# Patient Record
Sex: Female | Born: 1950 | Race: Black or African American | Hispanic: No | Marital: Married | Smoking: Never smoker
Health system: Southern US, Community
[De-identification: ages and names within clinical notes are randomized; demographics above are authoritative.]

## PROBLEM LIST (undated history)

## (undated) DIAGNOSIS — E119 Type 2 diabetes mellitus without complications: Secondary | ICD-10-CM

## (undated) DIAGNOSIS — I10 Essential (primary) hypertension: Secondary | ICD-10-CM

## (undated) DIAGNOSIS — E7800 Pure hypercholesterolemia, unspecified: Secondary | ICD-10-CM

---

## 2009-07-02 ENCOUNTER — Emergency Department (HOSPITAL_COMMUNITY): Admission: EM | Admit: 2009-07-02 | Discharge: 2009-07-02 | Payer: Self-pay | Admitting: Family Medicine

## 2010-07-11 LAB — GLUCOSE, CAPILLARY: Glucose-Capillary: 141 mg/dL — ABNORMAL HIGH (ref 70–99)

## 2011-03-23 ENCOUNTER — Ambulatory Visit: Payer: Self-pay | Admitting: Gastroenterology

## 2014-07-27 ENCOUNTER — Other Ambulatory Visit: Payer: Self-pay

## 2014-07-27 DIAGNOSIS — Z1231 Encounter for screening mammogram for malignant neoplasm of breast: Secondary | ICD-10-CM

## 2014-08-03 ENCOUNTER — Ambulatory Visit
Admission: RE | Admit: 2014-08-03 | Discharge: 2014-08-03 | Disposition: A | Discharge status: Home / Self Care | Payer: Medicaid Other | Source: Ambulatory Visit

## 2014-08-03 DIAGNOSIS — Z1231 Encounter for screening mammogram for malignant neoplasm of breast: Secondary | ICD-10-CM

## 2015-05-04 ENCOUNTER — Ambulatory Visit: Payer: Medicaid Other | Attending: Internal Medicine

## 2015-08-17 ENCOUNTER — Other Ambulatory Visit: Payer: Self-pay | Admitting: Internal Medicine

## 2015-08-17 DIAGNOSIS — E2839 Other primary ovarian failure: Secondary | ICD-10-CM

## 2015-09-03 ENCOUNTER — Other Ambulatory Visit: Payer: Self-pay | Admitting: Internal Medicine

## 2015-09-03 DIAGNOSIS — Z1231 Encounter for screening mammogram for malignant neoplasm of breast: Secondary | ICD-10-CM

## 2015-09-23 ENCOUNTER — Ambulatory Visit
Admission: RE | Admit: 2015-09-23 | Discharge: 2015-09-23 | Disposition: A | Discharge status: Home / Self Care | Payer: Medicare Other | Source: Ambulatory Visit | Attending: Internal Medicine | Admitting: Internal Medicine

## 2015-09-23 DIAGNOSIS — E2839 Other primary ovarian failure: Secondary | ICD-10-CM

## 2015-09-23 DIAGNOSIS — Z1231 Encounter for screening mammogram for malignant neoplasm of breast: Secondary | ICD-10-CM

## 2016-01-17 ENCOUNTER — Ambulatory Visit (HOSPITAL_COMMUNITY)
Admission: EM | Admit: 2016-01-17 | Discharge: 2016-01-17 | Disposition: A | Discharge status: Home / Self Care | Payer: Medicare Other | Attending: Internal Medicine | Admitting: Internal Medicine

## 2016-01-17 ENCOUNTER — Encounter (HOSPITAL_COMMUNITY): Payer: Self-pay | Admitting: Emergency Medicine

## 2016-01-17 DIAGNOSIS — R0789 Other chest pain: Secondary | ICD-10-CM

## 2016-01-17 DIAGNOSIS — M940 Chondrocostal junction syndrome [Tietze]: Secondary | ICD-10-CM

## 2016-01-17 DIAGNOSIS — T148XXA Other injury of unspecified body region, initial encounter: Secondary | ICD-10-CM

## 2016-01-17 HISTORY — DX: Pure hypercholesterolemia, unspecified: E78.00

## 2016-01-17 HISTORY — DX: Type 2 diabetes mellitus without complications: E11.9

## 2016-01-17 HISTORY — DX: Essential (primary) hypertension: I10

## 2016-01-17 MED ORDER — NAPROXEN 250 MG PO TABS: 250.0000 mg | ORAL_TABLET | Freq: Two times a day (BID) | ORAL | 0 refills | Status: DC

## 2016-01-17 NOTE — ED Triage Notes (Signed)
Patient has pain across chest and around right shoulder and upper back .  Pain for one week and pain is worse with movement, particularly movement of right arm.  Denies injury

## 2016-01-17 NOTE — ED Provider Notes (Signed)
CSN: [id number]     Arrival date & time 01/17/16  1451 History   First MD Initiated Contact with Patient 01/17/16 1619     Chief Complaint  Patient presents with  . Back Pain  . Chest Pain   (Consider location/radiation/quality/duration/timing/severity/associated sxs/prior Treatment) 65 y o F with son (tranaslator) C/O pain across anterior chest exacerbated with deep breath or other movements. Also c/o pain right upper posterior shoulder/back, especially movement and use of right arm. No hx of trauma.       Past Medical History:  Diagnosis Date  . Diabetes mellitus without complication ([place])   . High cholesterol   . Hypertension    History reviewed. No pertinent surgical history. No family history on file. Social History  Substance Use Topics  . Smoking status: Never Smoker  . Smokeless tobacco: Not on file  . Alcohol use No   OB History    No data available     Review of Systems  Constitutional: Negative for activity change, diaphoresis, fatigue and fever.  HENT: Negative.   Respiratory: Negative.  Negative for cough, chest tightness, shortness of breath and wheezing.   Cardiovascular: Negative for chest pain, palpitations and leg swelling.  Gastrointestinal: Negative.   Genitourinary: Negative.   Musculoskeletal: Positive for myalgias. Negative for neck pain and neck stiffness.  Neurological: Negative.   All other systems reviewed and are negative.   Allergies  Review of patient's allergies indicates no known allergies.  Home Medications   Prior to Admission medications   Medication Sig Start Date End Date Taking? Authorizing Provider  amLODipine (NORVASC) 10 MG tablet Take 10 mg by mouth daily.   Yes Historical Provider, MD  aspirin 81 MG chewable tablet Chew by mouth daily.   Yes Historical Provider, MD  cetirizine (ZYRTEC) 10 MG tablet Take 10 mg by mouth daily.   Yes Historical Provider, MD  insulin NPH-regular Human (NOVOLIN 70/30) (70-30) 100 UNIT/ML  injection Inject into the skin.   Yes Historical Provider, MD  lisinopril-hydrochlorothiazide (PRINZIDE,ZESTORETIC) 20-25 MG tablet Take 1 tablet by mouth daily.   Yes Historical Provider, MD  metFORMIN (GLUCOPHAGE) 1000 MG tablet Take 1,000 mg by mouth 2 (two) times daily with a meal.   Yes Historical Provider, MD  omeprazole (PRILOSEC) 20 MG capsule Take 20 mg by mouth daily.   Yes Historical Provider, MD  simvastatin (ZOCOR) 5 MG tablet Take 5 mg by mouth daily.   Yes Historical Provider, MD  thiamine (VITAMIN B-1) 100 MG tablet Take 100 mg by mouth daily.   Yes Historical Provider, MD  tiZANidine (ZANAFLEX) 2 MG tablet Take by mouth every 6 (six) hours as needed for muscle spasms.   Yes Historical Provider, MD  Vitamin D, Ergocalciferol, (DRISDOL) 50000 units CAPS capsule Take 50,000 Units by mouth every 7 (seven) days.   Yes Historical Provider, MD  naproxen (NAPROSYN) 250 MG tablet Take 1 tablet (250 mg total) by mouth 2 (two) times daily with a meal. 01/17/16   Janne Napoleon, NP   Meds Ordered and Administered this Visit  Medications - No data to display  BP 159/67 (BP Location: Left Arm)   Pulse 74   Temp 99.2 F (37.3 C) (Oral)   Resp 14   SpO2 100%  No data found.   Physical Exam  Constitutional: She is oriented to person, place, and time. She appears well-developed and well-nourished. No distress.  HENT:  Head: Normocephalic and atraumatic.  Eyes: EOM are normal.  Neck: Normal range of  motion. Neck supple.  Cardiovascular: Normal rate.   Murmur heard. 3/6 SEM  Pulmonary/Chest: Effort normal and breath sounds normal. She exhibits tenderness.  Tenderness across the anterior chest R>L to include right pectoralis maj as it inserts to the humerus.  Tenderness to the right supraspinatus, trapezius and right para thoracic musculature.  No discoloration, swelling or deformities.  Musculoskeletal: She exhibits tenderness. She exhibits no edema or deformity.  Lymphadenopathy:     She has no cervical adenopathy.  Neurological: She is alert and oriented to person, place, and time. No cranial nerve deficit.  Skin: Skin is warm and dry. Capillary refill takes less than 2 seconds.  Psychiatric: She has a normal mood and affect.  Nursing note and vitals reviewed.   Urgent Care Course   Clinical Course    Procedures (including critical care time)  Labs Review Labs Reviewed - No data to display  Imaging Review No results found.   Visual Acuity Review  Right Eye Distance:   Left Eye Distance:   Bilateral Distance:    Right Eye Near:   Left Eye Near:    Bilateral Near:         MDM   1. Chest wall pain   2. Costochondritis, acute   3. Muscle strain    Ice to the chest, heat to the upper back where there is muscle soreness and pain. Take Naprosyn twice a day as needed for pain. Be sure to take it with food.  Meds ordered this encounter  Medications  . omeprazole (PRILOSEC) 20 MG capsule    Sig: Take 20 mg by mouth daily.  Marland Kitchen amLODipine (NORVASC) 10 MG tablet    Sig: Take 10 mg by mouth daily.  Marland Kitchen lisinopril-hydrochlorothiazide (PRINZIDE,ZESTORETIC) 20-25 MG tablet    Sig: Take 1 tablet by mouth daily.  . simvastatin (ZOCOR) 5 MG tablet    Sig: Take 5 mg by mouth daily.  Marland Kitchen DISCONTD: meloxicam (MOBIC) 15 MG tablet    Sig: Take 15 mg by mouth daily.  Marland Kitchen tiZANidine (ZANAFLEX) 2 MG tablet    Sig: Take by mouth every 6 (six) hours as needed for muscle spasms.  . cetirizine (ZYRTEC) 10 MG tablet    Sig: Take 10 mg by mouth daily.  . insulin NPH-regular Human (NOVOLIN 70/30) (70-30) 100 UNIT/ML injection    Sig: Inject into the skin.  Marland Kitchen aspirin 81 MG chewable tablet    Sig: Chew by mouth daily.  . Vitamin D, Ergocalciferol, (DRISDOL) 50000 units CAPS capsule    Sig: Take 50,000 Units by mouth every 7 (seven) days.  Marland Kitchen thiamine (VITAMIN B-1) 100 MG tablet    Sig: Take 100 mg by mouth daily.  . metFORMIN (GLUCOPHAGE) 1000 MG tablet    Sig: Take 1,000  mg by mouth 2 (two) times daily with a meal.  . naproxen (NAPROSYN) 250 MG tablet    Sig: Take 1 tablet (250 mg total) by mouth 2 (two) times daily with a meal.    Dispense:  20 tablet    Refill:  0    Order Specific Question:   Supervising Provider    Answer:   Sherlene Shams [id number]       Janne Napoleon, NP 01/17/16 [phone]

## 2016-01-17 NOTE — Discharge Instructions (Signed)
Ice to the chest, heat to the upper back where there is muscle soreness and pain. Take Naprosyn twice a day as needed for pain. Be sure to take it with food.

## 2016-02-08 ENCOUNTER — Other Ambulatory Visit: Payer: Self-pay | Admitting: Gastroenterology

## 2016-02-08 DIAGNOSIS — R112 Nausea with vomiting, unspecified: Secondary | ICD-10-CM

## 2016-02-09 ENCOUNTER — Other Ambulatory Visit: Payer: Self-pay | Admitting: Gastroenterology

## 2016-02-09 DIAGNOSIS — R945 Abnormal results of liver function studies: Principal | ICD-10-CM

## 2016-02-09 DIAGNOSIS — R109 Unspecified abdominal pain: Secondary | ICD-10-CM

## 2016-02-09 DIAGNOSIS — R7989 Other specified abnormal findings of blood chemistry: Secondary | ICD-10-CM

## 2016-02-09 DIAGNOSIS — R112 Nausea with vomiting, unspecified: Secondary | ICD-10-CM

## 2016-02-10 ENCOUNTER — Ambulatory Visit
Admission: RE | Admit: 2016-02-10 | Discharge: 2016-02-10 | Disposition: A | Discharge status: Home / Self Care | Payer: Medicare Other | Source: Ambulatory Visit | Attending: Gastroenterology | Admitting: Gastroenterology

## 2016-02-10 DIAGNOSIS — R7989 Other specified abnormal findings of blood chemistry: Secondary | ICD-10-CM

## 2016-02-10 DIAGNOSIS — R945 Abnormal results of liver function studies: Principal | ICD-10-CM

## 2016-02-10 DIAGNOSIS — R109 Unspecified abdominal pain: Secondary | ICD-10-CM

## 2016-02-10 DIAGNOSIS — R112 Nausea with vomiting, unspecified: Secondary | ICD-10-CM

## 2016-02-10 MED ORDER — IOPAMIDOL (ISOVUE-300) INJECTION 61%
100.0000 mL | Freq: Once | INTRAVENOUS | Status: AC | PRN
  Administered 2016-02-10: 100 mL via INTRAVENOUS

## 2016-02-11 ENCOUNTER — Other Ambulatory Visit: Payer: Self-pay | Admitting: Gastroenterology

## 2016-02-11 DIAGNOSIS — R935 Abnormal findings on diagnostic imaging of other abdominal regions, including retroperitoneum: Secondary | ICD-10-CM

## 2016-02-15 ENCOUNTER — Ambulatory Visit
Admission: RE | Admit: 2016-02-15 | Discharge: 2016-02-15 | Disposition: A | Discharge status: Home / Self Care | Payer: Medicare Other | Source: Ambulatory Visit | Attending: Gastroenterology | Admitting: Gastroenterology

## 2016-02-15 DIAGNOSIS — R935 Abnormal findings on diagnostic imaging of other abdominal regions, including retroperitoneum: Secondary | ICD-10-CM

## 2016-02-15 MED ORDER — IOPAMIDOL (ISOVUE-300) INJECTION 61%
75.0000 mL | Freq: Once | INTRAVENOUS | Status: AC | PRN
  Administered 2016-02-15: 75 mL via INTRAVENOUS

## 2016-02-17 ENCOUNTER — Telehealth: Payer: Self-pay | Admitting: Oncology

## 2016-02-17 ENCOUNTER — Other Ambulatory Visit: Payer: Medicare Other

## 2016-02-17 NOTE — Telephone Encounter (Signed)
An interpreter has been cld for the pt for 11/3 appt w/[facility].

## 2016-02-18 ENCOUNTER — Encounter: Payer: Self-pay | Admitting: *Deleted

## 2016-02-18 ENCOUNTER — Telehealth: Payer: Self-pay | Admitting: Oncology

## 2016-02-18 ENCOUNTER — Ambulatory Visit (HOSPITAL_BASED_OUTPATIENT_CLINIC_OR_DEPARTMENT_OTHER): Payer: Medicare Other | Admitting: Oncology

## 2016-02-18 VITALS — BP 134/58 | HR 82 | Temp 97.8°F | Resp 17 | Ht 65.0 in | Wt 144.3 lb

## 2016-02-18 DIAGNOSIS — R599 Enlarged lymph nodes, unspecified: Secondary | ICD-10-CM | POA: Diagnosis not present

## 2016-02-18 DIAGNOSIS — C801 Malignant (primary) neoplasm, unspecified: Secondary | ICD-10-CM

## 2016-02-18 DIAGNOSIS — C787 Secondary malignant neoplasm of liver and intrahepatic bile duct: Secondary | ICD-10-CM | POA: Diagnosis not present

## 2016-02-18 DIAGNOSIS — R911 Solitary pulmonary nodule: Secondary | ICD-10-CM | POA: Diagnosis not present

## 2016-02-18 DIAGNOSIS — E279 Disorder of adrenal gland, unspecified: Secondary | ICD-10-CM

## 2016-02-18 MED ORDER — DIPHENHYDRAMINE HCL 25 MG PO CAPS: 25.0000 mg | ORAL_CAPSULE | Freq: Four times a day (QID) | ORAL | 0 refills | Status: AC | PRN

## 2016-02-18 MED ORDER — PROMETHAZINE HCL 12.5 MG PO TABS: 12.5000 mg | ORAL_TABLET | Freq: Four times a day (QID) | ORAL | 1 refills | Status: AC | PRN

## 2016-02-18 NOTE — Progress Notes (Signed)
[facility] Psychosocial Distress Screening Clinical Social Work  CSW met with pt; her son and was assisted by [place] interpreter at GI clinic to review role of CSW/Support Team, discuss resources and review distress screening protocol.Clinical Social Work was referred by distress screening protocol.  The patient scored a 8 on the Psychosocial Distress Thermometer which indicates severe distress. Clinical Social Worker met with them at length to assess for distress and other psychosocial needs. Pt resides with her son and his wife and their young children in [place]. Her son travels to [place] weekly for work and his wife does not drive. Transportation is their biggest concern. Pt has MCD, but son feels it is to cover her [facility] premium, but he plans to call MCD and explore this further. CSW reviewed ACS and discussed limited options out of [place] city limits. Family will attempt to transport when able. Pt reports her pain has been difficult and made coping harder. CSW reviewed common emotions and supports to assist pt and family. They are eager to went with dietician and this is pending. Pt and family very open to CSW supports and will follow up as needed.    ONCBCN DISTRESS SCREENING 02/18/2016  Screening Type Initial Screening  Distress experienced in past week (1-10) 8  Practical problem type Transportation  Emotional problem type Adjusting to illness  Physical Problem type Pain;Nausea/vomiting;Loss of appetitie  Physician notified of physical symptoms Yes  Referral to clinical social work Yes  Referral to dietition Yes  Referral to support programs Yes     Clinical Social Worker follow up needed: Yes.    If yes, follow up plan: See above Loren Racer, [place] Worker [place]  [facility] Phone: [phone] Fax: [phone]

## 2016-02-18 NOTE — Patient Instructions (Signed)
Care Plan Summary- 02/18/2016 Name:  Alexandra Hensley        DOB:  1950-09-24 Your Medical Team:  Medical Oncologist:  Dr. Ma Rings Radiation Oncologist:   Surgeon:   Type of Cancer: Undetermined at this time (? Lung, breast, GYN, GI)  Stage/Grade:  *Exact staging of your cancer is based on size of the tumor, depth of invasion, involvement of lymph nodes or not, and whether or not the cancer has spread beyond the primary site   Recommendations: Based on information available as of today's consult. Recommendations may change depending on the results of further tests or exams. 1) Liver biopsy early next week-radiology will call 2) Refer to dietician  3) Phenergan every 6 hours as needed for nausea (do not take at same time as Benadryl) 4) Benadryl 25 mg every 6 hours as needed for itching (do not take at same time as Phenergan) Next Steps: 1) Return to Dr. Benay Spice on 02/25/16 2)  3)    Questions? Merceda Elks, RN, BSN at [phone]. Manuela Schwartz is your Oncology Nurse Navigator and is available to assist you while you're receiving your medical care at [facility].

## 2016-02-18 NOTE — Progress Notes (Signed)
Oncology Nurse Navigator Documentation  Oncology Nurse Navigator Flowsheets 02/18/2016  Navigator Location [facility]  Referral date to RadOnc/MedOnc 02/16/2016  Navigator Encounter Type Clinic/MDC  Abnormal Finding Date 02/10/2016  Multidisiplinary Clinic Date 02/18/2016  Patient Visit Type MedOnc;Initial  Treatment Phase Abnormal Scans;Pre-Tx/Tx Discussion  Barriers/Navigation Needs Education;Family concerns;Transportation  Education Coping with Diagnosis/ Prognosis;Pain/ Symptom Management;Transport During Treatment  Interventions Transportation;Education  Education Method Verbal;Written  Support Groups/Services GI Support Group;[place];Other--[place] Support Services  Acuity Level 3  Time Spent with Patient 64  Met with patient, her son  and translator Alexandra Hensley during new patient visit. Explained the role of the GI Nurse Navigator and provided New Patient Packet with information on: 1. Support groups 2. Advanced Directives 3. Fall Safety Plan Answered questions, reviewed current staging plan using TEACH back and provided emotional support. Provided copy of plan. Primary site of her cancer is yet to be determined and she will be having a liver biopsy next week and return to Dr. Benay Spice on 02/25/16. She is married (husband has moved back to [place]) and lives with one of her sons in [place]. She speaks [place], but her two sons speak [place] well. He main complaints are of itching and nausea and pain. She has been trying to drink the low sugar Boost-was given more samples and coupons by CSW today after her assessment of patient during visit. Will arrange for her to see dietician soon. They have requested to maintain this same interpreter for future visits-made request to [email or web address].  Merceda Elks, RN, BSN GI Oncology [place]

## 2016-02-18 NOTE — Telephone Encounter (Signed)
Appointments scheduled per 02/18/16 los. AVS report and appointment schedule given to patient per 02/18/16 los.

## 2016-02-18 NOTE — Progress Notes (Signed)
[facility] New Patient Consult   Referring MD: Otis Brace, Md [place], [place] [address]   Kiaira Peak 65 y.o.  [birth year]    Reason for Referral: CT with hepatic and bone metastases   HPI: Ms. Ellisor presents today with a [place] interpreter and her English-speaking son. She was evaluated for epigastric pain in August. She underwent an upper endoscopy on 12/21/2015 that revealed an irregular Z line at 38 cm from the incisors. Erythematous mucosa without bleeding was noted at the gastric antrum. Biopsies from the stomach revealed active H. pylori gastritis. Biopsies from the irregular Z line revealed reflux changes. A colonoscopy the same day revealed hemorrhoids and was otherwise normal.  She was treated for H. pylori. She has persistent chest and upper abdominal pain. She developed nausea and vomiting. She saw Dr. Alessandra Bevels in follow-up on 02/08/2016. She was prescribed Zofran for nausea. She reports this caused palpitations. A chemistry panel was remarkable for elevation of the liver enzymes and bilirubin. She was referred for a CT of the abdomen on 02/10/2016. Tiny nodules were noted in the left lower lobe. Numerous hypovascular masses were seen in the liver diffusely consistent with metastases. No biliary ductal dilatation. No pancreas mass or pancreatic ductal dilatation. A 1.5 Zometa right adrenal nodule is suspicious for metastasis. Soft tissue prominence is noted at the GE junction a 1.5 cm porta hepatis node and a 1.4 cm left periaortic node were noted. There was also a 1.6 cm left common iliac node. She was referred for a chest CT 02/15/2016. There is a 17 mm precarinal node. Tiny right and left lung nodules were noted. A pathologic fracture through a lytic lesion is noted at the start of with additional lytic metastases in the mid sternum.  She complains of pain at the anterior chest. She has intermittent nausea and vomiting.  Past  Medical History:  Diagnosis Date  . Diabetes mellitus without complication ([place])   . High cholesterol   . Hypertension      .  G1 P1    .  Hepatitis C treated with medical therapy at the [facility]   Past surgical history: None   Medications: Reviewed  Allergies: No Known Allergies  Family history: No family history of cancer  Social History:   She has lived in [place] with her son for the past 7 years. She previously lived in [place]. She previously worked as an Surveyor, minerals. No transfusion history. No risk factor for HIV.   ROS:   Positives include: Anterior chest pain, nausea/vomiting, constipation, hiccups, solid dysphagia  A complete ROS was otherwise negative.  Physical Exam:  Blood pressure (!) 134/58, pulse 82, temperature 97.8 F (36.6 C), temperature source Oral, resp. rate 17, height 5\' 5"  (1.651 m), weight 144 lb 4.8 oz (65.5 kg), SpO2 99 %.  HEENT: Oropharynx without visible mass, upper and lower denture plate, neck without mass. Scleral icterus Lungs: Clear bilaterally Cardiac: Regular rate and rhythm Abdomen: No splenomegaly. The liver edge is palpable in the mid upper abdomen  Vascular: No leg edema Lymph nodes: No cervical, supraclavicular, axillary, or inguinal nodes Neurologic: Alert and oriented, the motor exam appears intact in the upper and lower extremities Skin: Jaundice Musculoskeletal: No spine tenderness Breasts: Bilateral breast without mass   LAB:  CBC From [facility] on 02/08/2016-chemotherapy 12.2, MCV 90.5, plate was QA348G, white count 8.9, ANC 6.8, absolute lymphocyte count 1.2  CMP   From [facility] on  02/08/2016-BUN 19, currently 0.86, calcium 10.2, albumin 3.8, bilirubin 3.4, alkaline phosphatase 1077, AST 107, ALT 59   Imaging:  As per history of present illness, CT images reviewed with Ms. Nalepa   Assessment/Plan:   1. Nausea/vomiting, abdominal pain, pruritus, solid dysphagia  2. CT abdomen  02/10/2016 and CT chest 02/15/2016 with small lung nodules, extensive liver metastases, pathologic fracture of the sternum, enlarged chest and abdominal lymph nodes, right adrenal nodule, ill-defined soft tissue prominence at the GE junction   Disposition:   Ms. Aman presents with abdominal pain, nausea/vomiting, pruritus, and dysphagia. The clinical presentation and CT scans are consistent with advanced metastatic carcinoma. No primary tumor site is apparent on review of her history, physical examination, and staging CT scans. She underwent upper and lower endoscopy procedures in September and no primary tumor site was found.  The differential diagnosis includes an upper GI malignancy that was not appreciated on the EGD and September versus metastatic carcinoma from another site. She has a history of hepatitis C, but this would be an unusual presentation for metastatic hepatocellular carcinoma.  I will refer her for a biopsy of the liver within the next few days. She will return for an office visit and further discussion next week. We may ask Dr. Alessandra Bevels to perform a repeat endoscopy.  We gave her a prescription for Phenergan to use as needed for nausea. She will use Benadryl as needed for pruritus. We recommended liquid nutrition supplements.  Approximately 50 minutes were spent with the patient today. The majority of the time was used for counseling and coordination of care.  Betsy Coder, MD  02/18/2016, 10:50 AM

## 2016-02-21 ENCOUNTER — Encounter: Payer: Self-pay | Admitting: Oncology

## 2016-02-21 NOTE — Progress Notes (Signed)
Prior auth request sent through Cover My Meds for Promethazine HCL.

## 2016-02-22 ENCOUNTER — Telehealth: Payer: Self-pay | Admitting: Oncology

## 2016-02-22 ENCOUNTER — Encounter (HOSPITAL_COMMUNITY): Payer: Self-pay

## 2016-02-22 ENCOUNTER — Telehealth: Payer: Self-pay | Admitting: *Deleted

## 2016-02-22 ENCOUNTER — Encounter: Payer: Self-pay | Admitting: Oncology

## 2016-02-22 ENCOUNTER — Other Ambulatory Visit: Payer: Self-pay | Admitting: Radiology

## 2016-02-22 ENCOUNTER — Other Ambulatory Visit: Payer: Self-pay | Admitting: Oncology

## 2016-02-22 ENCOUNTER — Ambulatory Visit (HOSPITAL_COMMUNITY)
Admission: RE | Admit: 2016-02-22 | Discharge: 2016-02-22 | Disposition: A | Discharge status: Home / Self Care | Payer: Medicare Other | Source: Ambulatory Visit | Attending: Oncology | Admitting: Oncology

## 2016-02-22 DIAGNOSIS — C801 Malignant (primary) neoplasm, unspecified: Secondary | ICD-10-CM | POA: Diagnosis present

## 2016-02-22 LAB — CBC
HCT: 34.6 % — ABNORMAL LOW (ref 36.0–46.0)
HEMOGLOBIN: 11.6 g/dL — AB (ref 12.0–15.0)
MCH: 29.7 pg (ref 26.0–34.0)
MCHC: 33.5 g/dL (ref 30.0–36.0)
MCV: 88.5 fL (ref 78.0–100.0)
Platelets: 179 10*3/uL (ref 150–400)
RBC: 3.91 MIL/uL (ref 3.87–5.11)
RDW: 17.6 % — ABNORMAL HIGH (ref 11.5–15.5)
WBC: 9.7 10*3/uL (ref 4.0–10.5)

## 2016-02-22 LAB — GLUCOSE, CAPILLARY: Glucose-Capillary: 164 mg/dL — ABNORMAL HIGH (ref 65–99)

## 2016-02-22 LAB — PROTIME-INR
INR: 1.09
Prothrombin Time: 14.2 seconds (ref 11.4–15.2)

## 2016-02-22 LAB — APTT: aPTT: 26 seconds (ref 24–36)

## 2016-02-22 MED ORDER — MIDAZOLAM HCL 2 MG/2ML IJ SOLN
INTRAMUSCULAR | Status: AC
  Filled 2016-02-22: qty 2

## 2016-02-22 MED ORDER — FENTANYL CITRATE (PF) 100 MCG/2ML IJ SOLN
INTRAMUSCULAR | Status: AC | PRN
  Administered 2016-02-22 (×2): 25 ug via INTRAVENOUS

## 2016-02-22 MED ORDER — SODIUM CHLORIDE 0.9 % IV SOLN
INTRAVENOUS | Status: AC | PRN
  Administered 2016-02-22: 10 mL/h via INTRAVENOUS

## 2016-02-22 MED ORDER — FENTANYL CITRATE (PF) 100 MCG/2ML IJ SOLN
INTRAMUSCULAR | Status: AC
  Filled 2016-02-22: qty 2

## 2016-02-22 MED ORDER — MIDAZOLAM HCL 2 MG/2ML IJ SOLN
INTRAMUSCULAR | Status: AC | PRN
  Administered 2016-02-22: 1 mg via INTRAVENOUS
  Administered 2016-02-22: 0.5 mg via INTRAVENOUS

## 2016-02-22 MED ORDER — SODIUM CHLORIDE 0.9 % IV SOLN: INTRAVENOUS | Status: DC

## 2016-02-22 MED ORDER — LIDOCAINE HCL 1 % IJ SOLN
INTRAMUSCULAR | Status: AC
  Filled 2016-02-22: qty 20

## 2016-02-22 NOTE — H&P (Signed)
    Chief Complaint: liver lesions  Referring Physician:Dr. Izola Price. [facility]  Supervising Physician: Daryll Brod  Patient Status: [facility] - Out-pt  HPI: Alexandra Hensley is an 65 y.o. female who was seen by Dr. Alessandra Bevels with GI secondary to complaints of epigastric pain.  She underwent and upper and lower endo in August where she was found to have H. Pylori.  This was treated, but no other signs of disease.  She was continuing to have symptoms in October at her follow up and a CT scan was then obtained of her abdomen.  This revealed lesions in her liver concerning for metastatic disease.  She then had a CT of the chest that revealed some pulmonary nodules as well as a lytic lesion in her spine.  She was referred to oncology who has requested a biopsy of a liver lesion to determine source of malignancy.  She has no complaints except for occasional "stomach-ache" type pains.  Her son translates for her.  Past Medical History:  Past Medical History:  Diagnosis Date  . Diabetes mellitus without complication ([place])   . High cholesterol   . Hypertension     Past Surgical History: History reviewed. No pertinent surgical history.  Family History: History reviewed. No pertinent family history.  Social History:  reports that she has never smoked. She does not have any smokeless tobacco history on file. She reports that she does not drink alcohol or use drugs.  Allergies: No Known Allergies  Medications: Medications reviewed in Epic  Please HPI for pertinent positives, otherwise complete 10 system ROS negative.  Mallampati Score: MD Evaluation Airway: WNL Heart: WNL Abdomen: WNL Chest/ Lungs: WNL ASA  Classification: 2 Mallampati/Airway Score: Two  Physical Exam: BP (!) (P) 157/70   Pulse (P) 87   Temp (P) 98.5 F (36.9 C)   Resp (P) 18   Ht (P) 5\' 5"  (1.651 m)   SpO2 (P) 99%  There is no height or weight on file to calculate BMI. General: pleasant, WD, WN white female who is  laying in bed in NAD HEENT: head has a scarf/hat in place.  Sclera are noninjected.  PERRL.  Ears and nose without any masses or lesions.  Mouth is pink and moist Heart: regular, rate, and rhythm.  Normal s1,s2. No obvious murmurs, gallops, or rubs noted.  Palpable radial and pedal pulses bilaterally Lungs: CTAB, no wheezes, rhonchi, or rales noted.  Respiratory effort nonlabored Abd: soft, NT, ND, +BS, no masses, hernias, or organomegaly Psych: A&Ox3 with an appropriate affect.   Labs: pending  Imaging: No results found.  Assessment/Plan 1. Liver lesions -we will plan to proceed with an US-guided liver lesion biopsy today. -vitals have been reviewed, labs are pending -Risks and Benefits discussed with the patient including, but not limited to bleeding, infection, damage to adjacent structures or low yield requiring additional tests. All of the patient's questions were answered, patient is agreeable to proceed. Consent signed and in chart.  Thank you for this interesting consult.  I greatly enjoyed meeting Alexandra Hensley and look forward to participating in their care.  A copy of this report was sent to the requesting provider on this date.  Electronically Signed: Henreitta Cea 02/22/2016, 12:27 PM   I spent a total of  30 Minutes   in face to face in clinical consultation, greater than 50% of which was counseling/coordinating care for liver lesions

## 2016-02-22 NOTE — Telephone Encounter (Signed)
Unable to reach pt son to inform of 11/15 appt per LOS. Will try later.

## 2016-02-22 NOTE — Discharge Instructions (Signed)

## 2016-02-22 NOTE — Telephone Encounter (Signed)
  Oncology Nurse Navigator Documentation  Navigator Location: [facility] (02/22/16 1012)   )Navigator Encounter Type: Telephone (02/22/16 1012) Telephone: Lahoma Crocker Call;Appt Confirmation/Clarification (02/22/16 1012)   Called son with change in follow up appointment to 2 pm on 02/25/16. They will be able to make the appointment. Patient is having her biopsy today.

## 2016-02-22 NOTE — Progress Notes (Signed)
Received prior authorization approval from [facility] for Promethazine 12.5MG  for 02/21/16-04/16/17. Called [facility] @[phone] to provide this information. Pharmacy staff states it went through and they would get it ready.

## 2016-02-22 NOTE — Procedures (Signed)
S/p US LIVER MASS BX, LEFT LOBE  NO COMP STABLE EBL < 5CC  PATH PENDING FULL REPORT IN PACS

## 2016-02-25 ENCOUNTER — Other Ambulatory Visit: Payer: Self-pay | Admitting: *Deleted

## 2016-02-25 ENCOUNTER — Encounter: Payer: Self-pay | Admitting: *Deleted

## 2016-02-25 ENCOUNTER — Ambulatory Visit (HOSPITAL_BASED_OUTPATIENT_CLINIC_OR_DEPARTMENT_OTHER): Payer: Medicare Other

## 2016-02-25 ENCOUNTER — Other Ambulatory Visit: Payer: Self-pay | Admitting: Oncology

## 2016-02-25 ENCOUNTER — Telehealth: Payer: Self-pay | Admitting: *Deleted

## 2016-02-25 ENCOUNTER — Ambulatory Visit (HOSPITAL_BASED_OUTPATIENT_CLINIC_OR_DEPARTMENT_OTHER): Payer: Medicare Other | Admitting: Oncology

## 2016-02-25 VITALS — BP 146/74 | HR 98 | Temp 98.3°F | Resp 17 | Ht 65.0 in | Wt 143.3 lb

## 2016-02-25 DIAGNOSIS — C799 Secondary malignant neoplasm of unspecified site: Secondary | ICD-10-CM

## 2016-02-25 DIAGNOSIS — C801 Malignant (primary) neoplasm, unspecified: Secondary | ICD-10-CM

## 2016-02-25 DIAGNOSIS — R911 Solitary pulmonary nodule: Secondary | ICD-10-CM

## 2016-02-25 DIAGNOSIS — R110 Nausea: Secondary | ICD-10-CM | POA: Diagnosis not present

## 2016-02-25 DIAGNOSIS — K5900 Constipation, unspecified: Secondary | ICD-10-CM

## 2016-02-25 DIAGNOSIS — E279 Disorder of adrenal gland, unspecified: Secondary | ICD-10-CM

## 2016-02-25 DIAGNOSIS — C787 Secondary malignant neoplasm of liver and intrahepatic bile duct: Secondary | ICD-10-CM | POA: Diagnosis not present

## 2016-02-25 DIAGNOSIS — L299 Pruritus, unspecified: Secondary | ICD-10-CM | POA: Diagnosis not present

## 2016-02-25 DIAGNOSIS — R599 Enlarged lymph nodes, unspecified: Secondary | ICD-10-CM

## 2016-02-25 DIAGNOSIS — C249 Malignant neoplasm of biliary tract, unspecified: Secondary | ICD-10-CM

## 2016-02-25 DIAGNOSIS — G893 Neoplasm related pain (acute) (chronic): Secondary | ICD-10-CM | POA: Diagnosis not present

## 2016-02-25 DIAGNOSIS — K831 Obstruction of bile duct: Secondary | ICD-10-CM

## 2016-02-25 LAB — COMPREHENSIVE METABOLIC PANEL
ALT: 138 U/L — AB (ref 0–55)
ANION GAP: 13 meq/L — AB (ref 3–11)
AST: 204 U/L (ref 5–34)
Albumin: 2.2 g/dL — ABNORMAL LOW (ref 3.5–5.0)
Alkaline Phosphatase: 1401 U/L — ABNORMAL HIGH (ref 40–150)
BUN: 18.2 mg/dL (ref 7.0–26.0)
CALCIUM: 9.7 mg/dL (ref 8.4–10.4)
CHLORIDE: 86 meq/L — AB (ref 98–109)
CO2: 27 mEq/L (ref 22–29)
CREATININE: 0.9 mg/dL (ref 0.6–1.1)
EGFR: 80 mL/min/{1.73_m2} — ABNORMAL LOW (ref >90)
Glucose: 207 mg/dl — ABNORMAL HIGH (ref 70–140)
Potassium: 3.5 mEq/L (ref 3.5–5.1)
Sodium: 126 mEq/L — ABNORMAL LOW (ref 136–145)
Total Bilirubin: 21.07 mg/dL (ref 0.20–1.20)
Total Protein: 6.9 g/dL (ref 6.4–8.3)

## 2016-02-25 MED ORDER — OXYCODONE-ACETAMINOPHEN 5-325 MG PO TABS
2.0000 | ORAL_TABLET | Freq: Once | ORAL | Status: AC
  Administered 2016-02-25: 2 via ORAL

## 2016-02-25 MED ORDER — OXYCODONE-ACETAMINOPHEN 5-325 MG PO TABS: 1.0000 | ORAL_TABLET | ORAL | 0 refills | Status: DC | PRN

## 2016-02-25 MED ORDER — OXYCODONE-ACETAMINOPHEN 5-325 MG PO TABS
ORAL_TABLET | ORAL | Status: AC
  Filled 2016-02-25: qty 2

## 2016-02-25 MED ORDER — DOCUSATE SODIUM 100 MG PO CAPS: 100.0000 mg | ORAL_CAPSULE | Freq: Two times a day (BID) | ORAL | 0 refills | Status: AC

## 2016-02-25 MED ORDER — POLYETHYLENE GLYCOL 3350 17 GM/SCOOP PO POWD: 17.0000 g | Freq: Every day | ORAL | 0 refills | Status: AC

## 2016-02-25 NOTE — Progress Notes (Signed)
Alex[facility] OFFICE PROGRESS NOTE   Diagnosis: Metastatic adenocarcinoma  INTERVAL HISTORY:   Alexandra Hensley returns as scheduled. She is accompanied by an interpreter and her sons. She has limited intake of food. She complains of diffuse abdominal pain. The pain is not relieved with tramadol. She underwent an ultrasound-guided biopsy of a liver lesion on 02/22/2016. The pathology [id number]) revealed adenocarcinoma. There is a poorly differentiated adenocarcinoma with abundant necrosis, intra-cytoplasmic and extracellular mucin. The malignant cells were positive for cytokeratin 7 with faint staining for cytokeratin 20. The cells were negative breast receptor, gross cystic disease fluid protein, and TTF-1-1. 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  She complains of constipation.   Objective:  Vital signs in last 24 hours:  Blood pressure (!) 146/74, pulse 98, temperature 98.3 F (36.8 C), temperature source Oral, resp. rate 17, height 5\' 5"  (1.651 m), weight 143 lb 4.8 oz (65 kg), SpO2 100 %.    HEENT:  scleral icterus Resp:  lungs clear anteriorly Cardio:  regular rate and rhythm, 2/6 systolic murmur GI:  the liver is palpable in the mid and left abdomen Vascular:  no leg edema  Skin: Jaundice     Lab Results:  Lab Results  Component Value Date   WBC 9.7 02/22/2016   HGB 11.6 (L) 02/22/2016   HCT 34.6 (L) 02/22/2016   MCV 88.5 02/22/2016   PLT 179 02/22/2016     Imaging:  US Biopsy  Result Date: 02/22/2016 INDICATION: Liver metastases, unknown primary EXAM: ULTRASOUND BIOPSY CORE LIVER MEDICATIONS: None. ANESTHESIA/SEDATION: Moderate (conscious) sedation was employed during this procedure. A total of Versed 1 mg and Fentanyl 50 mcg was administered intravenously. Moderate Sedation Time: 10 minutes. The patient's level of consciousness and vital signs were monitored continuously by radiology nursing  throughout the procedure under my direct supervision. FLUOROSCOPY TIME:  Fluoroscopy Time: None. COMPLICATIONS: None immediate. PROCEDURE: Informed written consent was obtained from the patient after a thorough discussion of the procedural risks, benefits and alternatives. All questions were addressed. Maximal Sterile Barrier Technique was utilized including caps, mask, sterile gowns, sterile gloves, sterile drape, hand hygiene and skin antiseptic. A timeout was performed prior to the initiation of the procedure. Preliminary ultrasound performed. Hypoechoic solid liver metastases present in the left hepatic lobe from a subxiphoid window. Overlying skin marked. This was correlated with the prior CT. Under sterile conditions and local anesthesia, a 17 gauge 6.8 cm access needle was advanced percutaneously under direct ultrasound into the left hepatic metastasis. Needle position confirmed with ultrasound. 3 18 gauge core biopsies obtained. Samples placed in formalin. Needle removed. Postprocedure imaging demonstrates no hemorrhage or hematoma. Patient tolerated the biopsy well. IMPRESSION: Successful ultrasound left hepatic mass 18 gauge core biopsies Electronically Signed   By: Jerilynn Mages.  Shick M.D.   On: 02/22/2016 15:32    Medications: I have reviewed the patient's current medications.  Assessment/Plan:   1. Metastatic adenocarcinoma   CT abdomen 02/10/2016 and CT chest 02/15/2016 with small lung nodules, extensive liver metastases, pathologic fracture of the sternum, enlarged chest and abdominal lymph nodes, right adrenal nodule, ill-defined soft tissue prominence at the GE junction 2. Chest/abdominal pain secondary to #1   3.  Nausea and pruritus secondary to #1   Disposition:   Alexandra Hensley has been diagnosed with metastatic adenocarcinoma involving the liver. She has unknown primary carcinoma, but I suspect a biliary or pancreatic primary. We decided to check a CA 19-9 today.  I discussed the  prognosis and treatment options with her. She understands no therapy will be curative. She has a poor performance status and large tumor burden at present. I recommend comfort care with a [facility] hospice referral. She agrees to a Hospice referral.  She had significant pain while in the office today. She was given 2 Percocet tablets and obtained significant relief. We gave her prescription for Percocet to use as needed.  She is taking multiple medications. We reviewed her medication list and eliminated some of the nonessential medications. She will begin a bowel regimen.  Alexandra Hensley will return for an office visit in approximately one week.  Approximately 40 minutes were spent with the patient today.  02/25/2016  3:09 PM

## 2016-02-25 NOTE — Telephone Encounter (Signed)
Referral called to [place]. Contact information  given for interpreter Kerrin Champagne [phone].

## 2016-02-25 NOTE — Progress Notes (Signed)
Oncology Nurse Navigator Documentation Oncology Nurse Navigator Documentation  Oncology Nurse Navigator Flowsheets 02/25/2016  Navigator Location [facility]  Referral date to RadOnc/MedOnc -  Navigator Encounter Type Follow-up Appt  Telephone -  Abnormal Finding Date -  Confirmed Diagnosis Date 02/22/2016  Multidisiplinary Clinic Date -  Patient Visit Type MedOnc;Follow-up  Treatment Phase [facility] Needs Education;Coordination of Care  Education Pain/ Symptom Management--nausea and bowel meds and how to take them  Interventions Medication Assistance;Coordination of Care  Coordination of Care Appts--scheduled F/U appointment to avoid long wait in scheduling line  Education Method Verbal;Written;Teach-back--taught both sons  Support Groups/Services -  Acuity Level 2  Time Spent with Patient 30  Called lab to come to exam room to draw labs due to her pain and weakness. Percocet given earlier has helped her pain significantly. MD recommends Hospice at this time and patient/sons agree. Her condition has declined since last visit and she is now jaundiced. Interpreter, Kerrin Champagne present for the visit to interpret for patient. Patient became tearful when told MD can't treat her now based on her current performance status and that the cancer can't be cured. Collaborative nurse will call in Hospice referral per order of Dr. Benay Spice.

## 2016-02-26 LAB — CANCER ANTIGEN 19-9: CA 19-9: 874 U/mL — ABNORMAL HIGH (ref 0–35)

## 2016-02-28 ENCOUNTER — Encounter (HOSPITAL_COMMUNITY): Payer: Self-pay

## 2016-02-28 ENCOUNTER — Telehealth: Payer: Self-pay | Admitting: *Deleted

## 2016-02-28 ENCOUNTER — Ambulatory Visit (HOSPITAL_COMMUNITY): Payer: Medicare Other

## 2016-02-28 ENCOUNTER — Ambulatory Visit (HOSPITAL_COMMUNITY)
Admission: RE | Admit: 2016-02-28 | Discharge: 2016-02-28 | Disposition: A | Discharge status: Home / Self Care | Payer: Medicare Other | Source: Ambulatory Visit | Attending: Oncology | Admitting: Oncology

## 2016-02-28 DIAGNOSIS — C799 Secondary malignant neoplasm of unspecified site: Secondary | ICD-10-CM

## 2016-02-28 DIAGNOSIS — I700 Atherosclerosis of aorta: Secondary | ICD-10-CM | POA: Diagnosis not present

## 2016-02-28 DIAGNOSIS — K831 Obstruction of bile duct: Secondary | ICD-10-CM | POA: Insufficient documentation

## 2016-02-28 DIAGNOSIS — R911 Solitary pulmonary nodule: Secondary | ICD-10-CM | POA: Diagnosis not present

## 2016-02-28 DIAGNOSIS — C772 Secondary and unspecified malignant neoplasm of intra-abdominal lymph nodes: Secondary | ICD-10-CM | POA: Insufficient documentation

## 2016-02-28 DIAGNOSIS — C787 Secondary malignant neoplasm of liver and intrahepatic bile duct: Secondary | ICD-10-CM | POA: Diagnosis not present

## 2016-02-28 DIAGNOSIS — C801 Malignant (primary) neoplasm, unspecified: Secondary | ICD-10-CM | POA: Diagnosis present

## 2016-02-28 MED ORDER — IOPAMIDOL (ISOVUE-300) INJECTION 61%
100.0000 mL | Freq: Once | INTRAVENOUS | Status: AC | PRN
  Administered 2016-02-28: 100 mL via INTRAVENOUS

## 2016-02-28 NOTE — Telephone Encounter (Signed)
Son returned call and agrees to CT scan this evening after navigator explained why it is suggested. He will pick up her contrast in few minutes. Reviewed the prep with him and suggested she take a nausea pill before trying to drink the oral contrast.

## 2016-02-28 NOTE — Telephone Encounter (Signed)
Called home # and left VM again for one of her sons to call regarding CT scan today. Still no answer at her son, Annasu's #.

## 2016-02-28 NOTE — Telephone Encounter (Signed)
Call from [facility] with hospice stating hospice MD wants to hold off on admission until after pt has CT scan. Dr. Benay Spice made aware. We will need to refer pt back to hospice once any procedures are complete.

## 2016-02-28 NOTE — Telephone Encounter (Signed)
Oncology Nurse Navigator Documentation  Oncology Nurse Navigator Flowsheets 02/28/2016  Navigator Location [facility]  Referral date to RadOnc/MedOnc -  Navigator Encounter Type Telephone  Telephone Outgoing Call;Patient Update;Appt Confirmation/Clarification  Abnormal Finding Date -  Confirmed Diagnosis Date -  Multidisiplinary Clinic Date -  Patient Visit Type -  Treatment Phase -  Barriers/Navigation Needs Coordination of Care--needs CT abd due to high bilirubin of 21.07. If bile duct dilated, GI could stent her possibly per Dr. Benay Spice  Education -  Interventions Coordination of Care  Coordination of Care Appts;Radiology00CT abdomen at [facility] today at 5:45/6:00 pm.  Drink contrast at 4 pm and 5 pm and NPO 4 hours prior  Education Method -  Support Groups/Services -  Acuity -  Time Spent with Patient 15  Left VM at home # to call navigator regarding CT scan. Unable to contact her son, Anass--phone would ring, then disconnect.

## 2016-02-29 ENCOUNTER — Telehealth: Payer: Self-pay | Admitting: *Deleted

## 2016-02-29 NOTE — Telephone Encounter (Signed)
Left VM with results of the CT scan and that [facility] will be calling for an appoitment to discuss a biliary duct drain being placed which could make her feel more comfortable. Also will be presenting her case at GI tumor conference on Wednesday. Will see her 11/16 as scheduled.

## 2016-03-01 ENCOUNTER — Encounter: Payer: Medicare Other | Admitting: Nutrition

## 2016-03-02 ENCOUNTER — Encounter: Payer: Self-pay | Admitting: *Deleted

## 2016-03-02 ENCOUNTER — Ambulatory Visit: Payer: Medicare Other | Admitting: Nutrition

## 2016-03-02 ENCOUNTER — Ambulatory Visit (HOSPITAL_BASED_OUTPATIENT_CLINIC_OR_DEPARTMENT_OTHER): Payer: Medicare Other | Admitting: Oncology

## 2016-03-02 VITALS — BP 147/81 | HR 102 | Temp 98.7°F | Resp 18 | Wt 148.7 lb

## 2016-03-02 DIAGNOSIS — C787 Secondary malignant neoplasm of liver and intrahepatic bile duct: Secondary | ICD-10-CM

## 2016-03-02 DIAGNOSIS — L299 Pruritus, unspecified: Secondary | ICD-10-CM | POA: Diagnosis not present

## 2016-03-02 DIAGNOSIS — E279 Disorder of adrenal gland, unspecified: Secondary | ICD-10-CM | POA: Diagnosis not present

## 2016-03-02 DIAGNOSIS — C801 Malignant (primary) neoplasm, unspecified: Secondary | ICD-10-CM

## 2016-03-02 DIAGNOSIS — R911 Solitary pulmonary nodule: Secondary | ICD-10-CM

## 2016-03-02 DIAGNOSIS — G893 Neoplasm related pain (acute) (chronic): Secondary | ICD-10-CM | POA: Diagnosis not present

## 2016-03-02 DIAGNOSIS — R110 Nausea: Secondary | ICD-10-CM

## 2016-03-02 DIAGNOSIS — M79604 Pain in right leg: Secondary | ICD-10-CM

## 2016-03-02 DIAGNOSIS — R599 Enlarged lymph nodes, unspecified: Secondary | ICD-10-CM

## 2016-03-02 DIAGNOSIS — C249 Malignant neoplasm of biliary tract, unspecified: Secondary | ICD-10-CM

## 2016-03-02 DIAGNOSIS — K838 Other specified diseases of biliary tract: Secondary | ICD-10-CM

## 2016-03-02 NOTE — Progress Notes (Signed)
[place] OFFICE PROGRESS NOTE   Diagnosis: Metastatic carcinoma  INTERVAL HISTORY:   When she was here last week the bilirubin was markedly elevated. We obtained a CT of the abdomen on 02/28/2016. No common bowel duct dilatation. New mild right intrahepatic biliary dilatation, but no left liver dilatation. She was scheduled to see gastroenterology earlier this week and the appointment was rescheduled for later today. She continues to have pruritus. The pruritus is relieved with Benadryl. Her pain is under better control with oxycodone. She presents today with a [place] interpreter and her son. She has new pain with standing at the right upper posterior leg today.  Objective:  Vital signs in last 24 hours:  Blood pressure (!) 147/81, pulse (!) 102, temperature 98.7 F (37.1 C), temperature source Oral, resp. rate 18, weight 148 lb 11.2 oz (67.4 kg), SpO2 96 %.   Resp: Lungs clear bilaterally Cardio: Regular rate and rhythm GI: The liver is palpable in the midabdomen Vascular: No leg edema, no palpable cord or erythema in the right leg. Tender at the right upper posterior thigh without a palpable mass.  Skin: Jaundice     Lab Results:  Lab Results  Component Value Date   WBC 9.7 02/22/2016   HGB 11.6 (L) 02/22/2016   HCT 34.6 (L) 02/22/2016   MCV 88.5 02/22/2016   PLT 179 02/22/2016  CA 19-9 on 02/25/2016-874  02/25/2016-bilirubin 21.07, alk phosphatase 1401  Imaging:  Ct Abdomen W Contrast  Result Date: 02/28/2016 CLINICAL DATA:  65 year old female with new diagnosis of metastatic adenocarcinoma of unknown primary based on 02/22/2016 liver mass biopsy, presenting with worsening upper abdominal pain radiating to the back. Clinical concern for biliary obstruction. EXAM: CT ABDOMEN WITH CONTRAST TECHNIQUE: Multidetector CT imaging of the abdomen was performed using the standard protocol following bolus administration of intravenous contrast. CONTRAST:  126m  ISOVUE-300 IOPAMIDOL (ISOVUE-300) INJECTION 61% COMPARISON:  02/10/2016 CT abdomen. FINDINGS: Lower chest: Anterior right lower lobe 5 mm solid pulmonary nodule (series 7/ image 1) is slightly increased from 4 mm on 02/10/2016. Hepatobiliary: Liver is enlarged by numerous (greater than 20) confluent heterogeneously hypoenhancing liver masses of varying size, with representative masses as follows: - posterior upper right liver lobe 6.3 x 6.1 cm mass (series 2/image 7), previously 6.4 x 6.0 cm, not appreciably changed - lateral segment left liver lobe 3.1 x 2.8 cm mass (series 2/image 21), previously 3.0 x 2.8 cm, not appreciably changed - segment 4B left liver lobe 2.8 x 2.5 cm mass (series 2/image 23), previously 2.5 x 2.4 cm, mildly increased Relatively contracted gallbladder with diffuse gallbladder wall thickening, not appreciably changed. Ill-defined dense material in the gallbladder lumen could represent sludge and/ or gallstones. There is new mild intrahepatic biliary ductal dilatation in the right liver lobe. No intrahepatic biliary ductal dilatation in the left liver lobe. Normal caliber common bile duct (3 mm diameter). Pancreas: Stable top-normal caliber main pancreatic duct (2 mm diameter). No discrete pancreatic mass. Spleen: Normal size. No mass. Adrenals/Urinary Tract: Stable 1.7 cm right adrenal nodule. No discrete left adrenal nodule. No hydronephrosis. No renal mass. Stomach/Bowel: Grossly normal stomach. Visualized small and large bowel is normal caliber, with no bowel wall thickening. Vascular/Lymphatic: Atherosclerotic nonaneurysmal abdominal aorta. Stable narrowing of the main portal vein by porta hepatis lymphadenopathy. Main portal vein remains patent. Patent splenic and right renal vein. Stable narrowing of the left renal vein by extrinsic compression from retroperitoneal adenopathy. No appreciable change in widespread retroperitoneal lymphadenopathy involving the  gastrohepatic ligament,  porta hepatis, portacaval, aortocaval and left para-aortic chains. For example a 1.4 cm gastrohepatic ligament node (series 2/ image 16), previously 1.5 cm using similar measurement technique, unchanged. A 1.5 cm portacaval node (series 2/ image 24), stable. A 1.4 cm left para-aortic node (series 2/image 31), stable. Other: No pneumoperitoneum. Trace upper abdominal ascites. No focal fluid collection. Musculoskeletal: No aggressive appearing focal osseous lesions. Mild-to-moderate thoracolumbar spondylosis. IMPRESSION: 1. Widespread hepatic metastatic disease is stable mildly increased since 02/10/2016. 2. New mild intrahepatic biliary ductal dilatation in the right liver lobe, due to obstruction by confluent central right liver lobe metastases. No intrahepatic biliary ductal dilatation in the left liver lobe. Normal caliber common bile duct. 3. Widespread retroperitoneal nodal metastatic disease appears stable. Stable extrinsic narrowing of the main portal and left renal veins by retroperitoneal adenopathy. 4. Right lower lobe pulmonary nodule has grown slightly since 02/10/2016, suspicious for pulmonary metastasis . 5. Stable right adrenal nodule suspicious for metastasis. 6. Trace upper abdominal ascites. 7. Contracted thick-walled gallbladder with sludge versus gallstones, unchanged. 8. Aortic atherosclerosis . Electronically Signed   By: Ilona Sorrel M.D.   On: 02/28/2016 19:57    Medications: I have reviewed the patient's current medications.  Assessment/Plan: 1.  Metastatic adenocarcinoma   CT abdomen 02/10/2016 and CT chest 02/15/2016 with small lung nodules, extensive liver metastases, pathologic fracture of the sternum, enlarged chest and abdominal lymph nodes, right adrenal nodule, ill-defined soft tissue prominence at the GE junction  CT abdomen/pelvis 02/28/2016-stable liver metastases, new mild right intrahepatic biliary ductal dilatation, no left intrahepatic ductal dilatation, normal  caliber common bile duct 2. Chest/abdominal pain secondary to #1   3.  Nausea and pruritus secondary to #1    Disposition:  Ms. Fizer appears more comfortable today with the current narcotic regimen. She will continue oxycodone for pain and Benadryl for pruritus. She will the role in the [facility] hospice program.  We presented her case at the GI tumor conference yesterday. The consensus opinion is to recommend Hospice care. The feeling of the conference attendees was that it is unlikely she will benefit from stenting of the right biliary system. She is scheduled to see gastroenterology later today. I will defer a decision on placement of a bile duct stent to them.  She will return for an office visit here in 2 weeks. I suspect the right leg discomfort is related to benign musculoskeletal condition. I have a low suspicion for a deep vein thrombosis.  Betsy Coder, MD  03/02/2016  2:41 PM

## 2016-03-02 NOTE — Progress Notes (Signed)
[place] Work  Clinical Social Work was referred by patient navigator for assistance with care giving concerns.  Clinical Social Worker met with son of patient, Anass at [facility] to offer support and assess for needs.  Pt has three sons and two are here locally, but often travel for work. One son recently had a new baby and has several small children at home. Family is hopeful that pt's third son could come to assist in care giving from the [place]. CSW reviewed process to work to obtain visa for brother. CSW provided son with a list of needed items. CSW willing to assist with process as able. Son stated understanding of process and will reach out as needed.   Clinical Social Work interventions: Caregiving concerns  Loren Racer, [place] Worker [place]  [place] Phone: [phone] Fax: [phone]

## 2016-03-02 NOTE — Progress Notes (Signed)
Oncology Nurse Navigator Documentation  Oncology Nurse Navigator Flowsheets 03/02/2016  Navigator Location [facility]  Referral date to RadOnc/MedOnc -  Navigator Encounter Type Follow-up Appt  Telephone -  Abnormal Finding Date -  Confirmed Diagnosis Date -  Multidisiplinary Clinic Date -  Patient Visit Type MedOnc;Follow-up  Treatment Phase Other--supportive care  Barriers/Navigation Needs Coordination of Care;Family concerns-how to get a family member to [place] to take care of the patient  Education Other--reviewed pain management  Interventions Referrals;Coordination of Care  Referrals Social Work  Coordination of Care Appts--was able to get dietician to see her sooner since she has to go to [facility] at 4:15 today. Escorted patient, son and interpreter to office. Scheduled f/u appointment and made copy for interpreter as well.  Education Method -  Support Groups/Services -  Acuity Level 2  Time Spent with Patient 30

## 2016-03-02 NOTE — Progress Notes (Signed)
65 year old female diagnosed with metastatic adenocarcinoma who is a patient of Dr. Julieanne Manson.  Past medical history includes diabetes, hypertension, hypercholesterolemia, and hepatitis C  Medications include vitamin B6, insulin, Protonix, Phenergan, and vitamin B1.  Labs include glucose 164.  Height: 5 feet 5 inches. Weight: 148 pounds. BMI: 24.0.  I met with patient, her son, and interpreter. She reports poor appetite and nausea. She states she takes two nausea medications on a regular basis. She tried oral nutrition supplements and likes them.  She was told to drink 3 a day. Noted discussion regarding hospice enrollment.  Nutrition diagnosis:  Food and nutrition related knowledge deficit related to metastatic adenocarcinoma as evidenced by no prior need for nutrition related information.  Intervention: Educated patient to try to eat small amounts of food more often. Encouraged patient to try to drink Ensure Plus or equivalent 2-3 times daily. Provided complimentary case of Ensure Plus. Encouraged patient to continue nausea medication and discuss with physician if nausea is not improved. Reviewed high-calorie high-protein foods. Questions were answered.  Teach back method used.  Contact information provided.  Monitoring, evaluation, goals:  Patient will tolerate adequate calories and protein to minimize weight loss and improve quality-of-life.  Next visit: To be scheduled as needed.  **Disclaimer: This note was dictated with voice recognition software. Similar sounding words can inadvertently be transcribed and this note may contain transcription errors which may not have been corrected upon publication of note.**

## 2016-03-06 ENCOUNTER — Telehealth: Payer: Self-pay | Admitting: *Deleted

## 2016-03-06 ENCOUNTER — Other Ambulatory Visit: Payer: Self-pay | Admitting: *Deleted

## 2016-03-06 MED ORDER — OXYCODONE-ACETAMINOPHEN 5-325 MG PO TABS: 1.0000 | ORAL_TABLET | ORAL | 0 refills | Status: AC | PRN

## 2016-03-06 NOTE — Telephone Encounter (Signed)
Son called that his mother needs refill on her pain medication-is almost out. Forwarded request to Dr. Gearldine Shown collaborative nurse and told son he will be called when script is ready for pick up.

## 2016-03-06 NOTE — Telephone Encounter (Signed)
Call placed to patient's son to notify him that prescription for Percocet is ready for pick up.  Pt.'s son appreciative of call.

## 2016-03-08 ENCOUNTER — Telehealth: Payer: Self-pay

## 2016-03-08 NOTE — Telephone Encounter (Signed)
Alexandra Hensley with hospice called to find out if pt had PICC or PORT. No note in chart found. She also reviewed medication list with this RN. Hospice is seeing pt today with an interpreter.

## 2016-03-14 ENCOUNTER — Telehealth: Payer: Self-pay | Admitting: *Deleted

## 2016-03-14 ENCOUNTER — Other Ambulatory Visit: Payer: Self-pay | Admitting: *Deleted

## 2016-03-14 MED ORDER — MORPHINE SULFATE (CONCENTRATE) 10 MG /0.5 ML PO SOLN: ORAL | 0 refills | Status: DC

## 2016-03-14 MED ORDER — LORAZEPAM 0.5 MG PO TABS: ORAL_TABLET | ORAL | 0 refills | Status: AC

## 2016-03-14 NOTE — Telephone Encounter (Signed)
Message received from Vicie Mutters with [facility] requesting liquid anti-emetic and pain medicine for patient.  She stated that pt is transitioning and is unable to take PO medications.  Per Dr. Benay Spice, patient may discontinue all oral medications at this time.  Prescription faxed to [facility] on [address] for Ativan and Roxanol per order of Dr. Benay Spice.  Call placed back to Vicie Mutters and message left to inform her of MD orders.

## 2016-03-15 ENCOUNTER — Other Ambulatory Visit: Payer: Self-pay | Admitting: *Deleted

## 2016-03-15 ENCOUNTER — Telehealth: Payer: Self-pay | Admitting: *Deleted

## 2016-03-15 ENCOUNTER — Inpatient Hospital Stay (HOSPITAL_COMMUNITY)
Admission: AD | Admit: 2016-03-15 | Discharge: 2016-03-17 | DRG: 436 | Disposition: E | Payer: Medicare Other | Source: Ambulatory Visit | Attending: Oncology | Admitting: Oncology

## 2016-03-15 ENCOUNTER — Ambulatory Visit: Payer: Medicare Other | Admitting: Oncology

## 2016-03-15 ENCOUNTER — Encounter (HOSPITAL_COMMUNITY): Payer: Self-pay

## 2016-03-15 VITALS — BP 70/40 | HR 83 | Temp 98.1°F | Resp 36 | Ht 65.0 in

## 2016-03-15 DIAGNOSIS — R599 Enlarged lymph nodes, unspecified: Secondary | ICD-10-CM

## 2016-03-15 DIAGNOSIS — B1920 Unspecified viral hepatitis C without hepatic coma: Secondary | ICD-10-CM | POA: Diagnosis present

## 2016-03-15 DIAGNOSIS — G893 Neoplasm related pain (acute) (chronic): Secondary | ICD-10-CM

## 2016-03-15 DIAGNOSIS — R911 Solitary pulmonary nodule: Secondary | ICD-10-CM | POA: Diagnosis not present

## 2016-03-15 DIAGNOSIS — E119 Type 2 diabetes mellitus without complications: Secondary | ICD-10-CM | POA: Diagnosis present

## 2016-03-15 DIAGNOSIS — C249 Malignant neoplasm of biliary tract, unspecified: Secondary | ICD-10-CM | POA: Diagnosis present

## 2016-03-15 DIAGNOSIS — C801 Malignant (primary) neoplasm, unspecified: Secondary | ICD-10-CM

## 2016-03-15 DIAGNOSIS — L299 Pruritus, unspecified: Secondary | ICD-10-CM | POA: Diagnosis present

## 2016-03-15 DIAGNOSIS — I10 Essential (primary) hypertension: Secondary | ICD-10-CM | POA: Diagnosis present

## 2016-03-15 DIAGNOSIS — C221 Intrahepatic bile duct carcinoma: Secondary | ICD-10-CM | POA: Diagnosis present

## 2016-03-15 DIAGNOSIS — E279 Disorder of adrenal gland, unspecified: Secondary | ICD-10-CM

## 2016-03-15 DIAGNOSIS — Z515 Encounter for palliative care: Secondary | ICD-10-CM | POA: Diagnosis present

## 2016-03-15 DIAGNOSIS — C787 Secondary malignant neoplasm of liver and intrahepatic bile duct: Principal | ICD-10-CM | POA: Diagnosis present

## 2016-03-15 DIAGNOSIS — I959 Hypotension, unspecified: Secondary | ICD-10-CM | POA: Diagnosis present

## 2016-03-15 DIAGNOSIS — R110 Nausea: Secondary | ICD-10-CM

## 2016-03-15 MED ORDER — LORAZEPAM 2 MG/ML IJ SOLN
0.5000 mg | INTRAMUSCULAR | Status: DC | PRN
  Administered 2016-03-15: 0.5 mg via INTRAVENOUS
  Filled 2016-03-15: qty 1

## 2016-03-15 MED ORDER — SODIUM CHLORIDE 0.9 % IV SOLN
INTRAVENOUS | Status: DC
  Administered 2016-03-15: 15:00:00 via INTRAVENOUS

## 2016-03-15 MED ORDER — MORPHINE SULFATE 2 MG/ML IJ SOLN: 2.0000 mg | INTRAMUSCULAR | Status: DC | PRN

## 2016-03-15 MED ORDER — SODIUM CHLORIDE 0.9 % IV SOLN
2.0000 mg/h | INTRAVENOUS | Status: DC
  Filled 2016-03-15: qty 10
  Filled 2016-03-15: qty 5

## 2016-03-15 MED ORDER — MORPHINE SULFATE (CONCENTRATE) 10 MG /0.5 ML PO SOLN: ORAL | 0 refills | Status: AC

## 2016-03-15 MED ORDER — MORPHINE SULFATE (CONCENTRATE) 10 MG /0.5 ML PO SOLN: ORAL | 0 refills | Status: DC

## 2016-03-15 MED ORDER — MORPHINE SULFATE (PF) 2 MG/ML IV SOLN
2.0000 mg | INTRAVENOUS | Status: DC | PRN
Start: 2016-03-15 — End: 2016-03-15
  Administered 2016-03-15: 4 mg via INTRAVENOUS
  Filled 2016-03-15: qty 2

## 2016-03-17 NOTE — Progress Notes (Signed)
See dictated history and physical 09-Apr-2016

## 2016-03-17 NOTE — Telephone Encounter (Signed)
Call from Vicie Mutters, Hospice RN reporting pharmacy did not receive faxes sent on 03/14/16. Confirmed with pharmacy. Prescriptions re-faxed.

## 2016-03-17 NOTE — Progress Notes (Signed)
Patient is no longer breathing, with no pulse; son at bedside & caplain on the way to the bedside Time of death [place], Rollene Fare at bedside as well

## 2016-03-17 NOTE — Progress Notes (Signed)
Nutrition Brief Note  Pt screened on MST report. Chart reviewed. Pt with advanced metastatic adenocarcinoma with extensive liver mets. Pt now transitioning to comfort care. Dr. Gearldine Shown note today states that pt was unable to take medications PTA and had been unable to consume PO nutrition x5 days PTA; note also states no beds are available at [facility]. No further nutrition interventions warranted at this time.  Please re-consult as needed.      Jarome Matin, MS, RD, LDN, [facility] Inpatient Clinical Dietitian Pager # [phone] After hours/weekend pager # [phone]

## 2016-03-17 NOTE — Progress Notes (Signed)
-  AC notified -bed placement notified -[place] donor notified -Dr. Bernette Redbird office notified -Funneral Home Information: [facility] [place] [address] [place].  [place] [phone] -son at bedside

## 2016-03-17 NOTE — Progress Notes (Signed)
Patient is having agonal breathing, son at bedside; son requests morphine gtt.  Dr. Benay Spice wants to start morphine gtt at 2mg /hr

## 2016-03-17 NOTE — Progress Notes (Addendum)
Chaplain paged by RN to report pt death.     Provided support with pt's son at bedside.  Family is Muslim and would like to perform ritual washing of body at mosque tomorrow AM.  They are in contact with their [place], who recommends funeral home [facility] on [address]. to transport body.  Sons wish for funeral home to pick up body in pt room and for pt to not go to morgue.      [place], [place]

## 2016-03-17 NOTE — Telephone Encounter (Signed)
Left VM that they no longer wish to have interpreter at her office visits. Prefer to keep things private within the family. Requested this be removed from her chart. Also concerned that pharmacy is saying they have not gotten the script yet for her pain medication.  The script was faxed on 11/28 and again X 2 today from two separate fax machines after pharmacy fax # was confirmed. Pharmacy tells navigator that it has not crossed over into their system yet. Notified collaborative nurse of the issue.

## 2016-03-17 NOTE — H&P (Signed)
Patient History and Physical   Evangelynn Zivkovich BY:[id number] 1951-02-19 65 y.o. 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 She is now admitted for terminal care.  HPI:  Ms.Hagner presented with chest/abdominal pain and nausea. CTs 02/10/2016 and 02/15/2016 revealed lung nodules, extensive liver metastases, and a pathologic fracture of the sternum. She underwent an ultrasound-guided biopsy of a liver lesion on 02/22/2016 and the pathology confirmed metastatic adenocarcinoma, favoring a pancreaticobiliary primary.  She has jaundice and pruritus.  Her case was presented at the GI tumor conference and it was felt she was unlikely to benefit from a biliary stent. She was referred to the [facility] program.  We were contacted yesterday by the hospice RN with a report that Ms.Setters was no longer able to take oral medications. She was prescribed some lingual Ativan and Roxanol. She presented to the Cancer center today with her family. They report she has been confused with limited oral intake for the past 5 days. She was noted to be moaning and hypotensive upon arrival to the [facility].  She is accompanied by her son who speaks [place]. He reports she is confused but has been able to answer simple questions. No fever.     PMH:  Past Medical History:  Diagnosis Date  . Diabetes mellitus without complication ([place])   . High cholesterol   . Hypertension     .  G1 P1    .  Hepatitis C treated with medical therapy at the [facility]  Past surgical history: None  Allergies:  No Known Allergies  Medications:  's electronic medical record Social History:   She lives in [place] with her son. She recently lived in [place]. She worked as an Surveyor, minerals. No transfusion history. No risk factor for HIV.  Family History: no family history of cancer  Review of Systems: as per history of present illness, unable to obtain  review of systems from patient     Physical Exam: BP 70/40, pulse 83, respirations 36, temperature 98.1 axillary  HEENT: Scleral icterus, the mucous membranes are moist Lungs: End inspiratory rales at the left upper anterior chest Cardiac: Regular rhythm Abdomen: No hepatomegaly, moans with palpation in the upper abdomen  Vascular: Trace low leg edema bilaterally Neurologic: Moans, opens eyes, confused, answers yes and no to her son Skin: Jaundice  Lab Results: None   Impression and Plan: 1.  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 Chest/abdominal pain secondary to #1  3. Nausea and pruritus secondary to #1  4.   History of hepatitis C-treated at the [facility]  Ms. Dhein has advanced metastatic adenocarcinoma with extensive liver metastases. She is currently enrolled in the [facility] hospice program. She presents today with a marked decline in her performance status over the past few weeks. She appears to be terminal.  I discussed the situation with her son who speaks [place]. We discussed CPR and ACLS issues. He agrees to a no CODE BLUE status. He understands that she may die in hours to a few days. The goal is comfort care. There are no beds available at [facility]. He prefers  hospital admission as opposed to having her return home. She will be admitted to the oncology unit at [facility] for terminal care measures.      Betsy Coder, MD  03/17/16, 2:00 PM

## 2016-03-17 NOTE — Telephone Encounter (Signed)
Call from [place] at [facility]- they did receive the fax for [place]. They are unable to fill for 59mL. Requesting new script for 78mL pre-packaged bottle.  New script faxed to [facility]. Confirmed script was received. Per Caryl Pina, they do not have any billing info for pt. Requested hospice contact pharmacy.  Called pt's son to inform him that new script has been sent.

## 2016-03-17 NOTE — Progress Notes (Signed)
Wasted 250cc of morphine drip with Sandie Ano, RN in sink.

## 2016-03-17 DEATH — deceased

## 2017-07-31 IMAGING — CT CT ABDOMEN W/ CM
2 of 4 series · 14 of 46 positions shown, 16 images · IV contrast (APPLIED)
Comparison: 02/10/2016 CT abdomen.

CLINICAL DATA: 64-year-old female with new diagnosis of metastatic
adenocarcinoma of unknown primary based on 02/22/2016 liver mass
biopsy, presenting with worsening upper abdominal pain radiating to
the back. Clinical concern for biliary obstruction.

EXAM:
CT ABDOMEN WITH CONTRAST
TECHNIQUE: Multidetector CT imaging of the abdomen was performed using the
standard protocol following bolus administration of intravenous
contrast.
CONTRAST:  100mL MAG3HO-JBB IOPAMIDOL (MAG3HO-JBB) INJECTION 61%

[Series 2: axial st · axial · 0.73mm/px · z∈[-272,-62]mm · 11 of 48 slices shown, 13 images]
[im 3/48  soft-tissue]
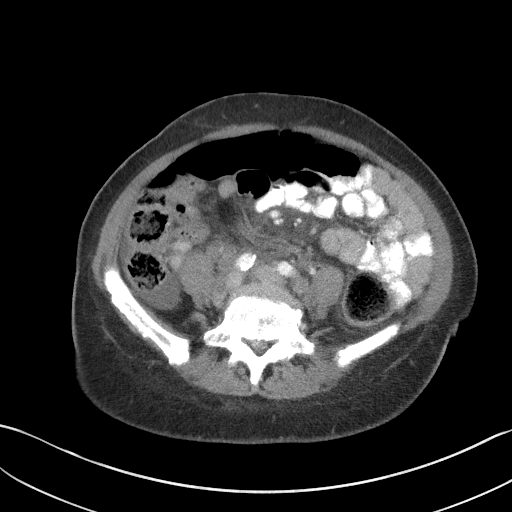
[im 3/48  bone]
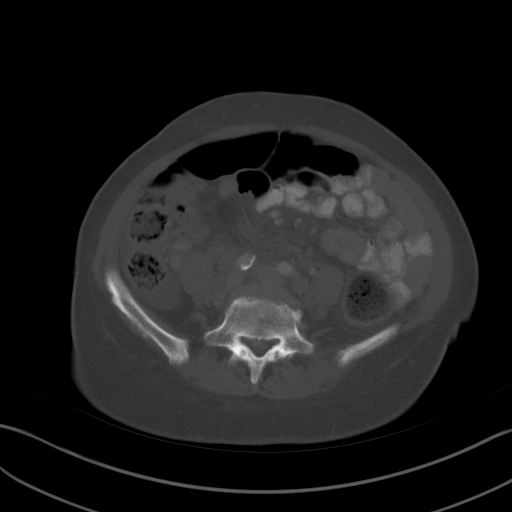
[im 9/48  soft-tissue]
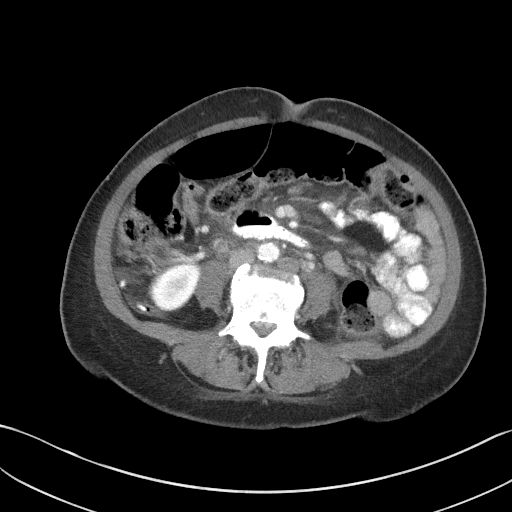
[im 12/48  soft-tissue]
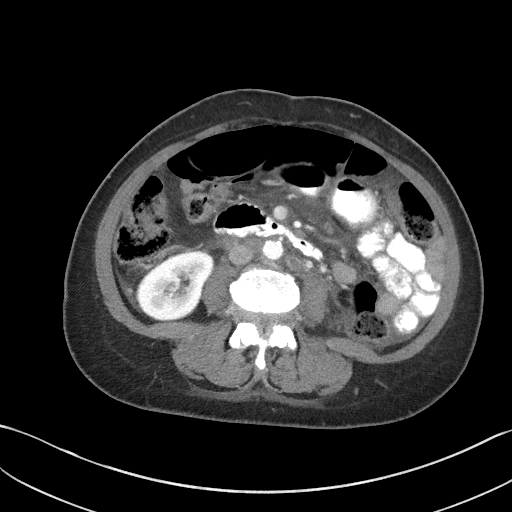
[im 15/48  soft-tissue]
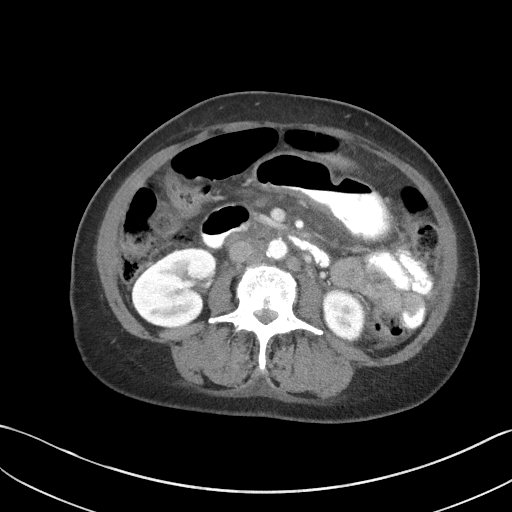
[im 21/48  soft-tissue]
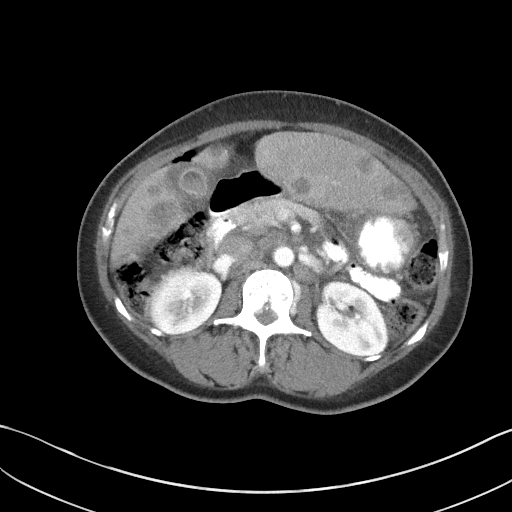
[im 24/48  soft-tissue]
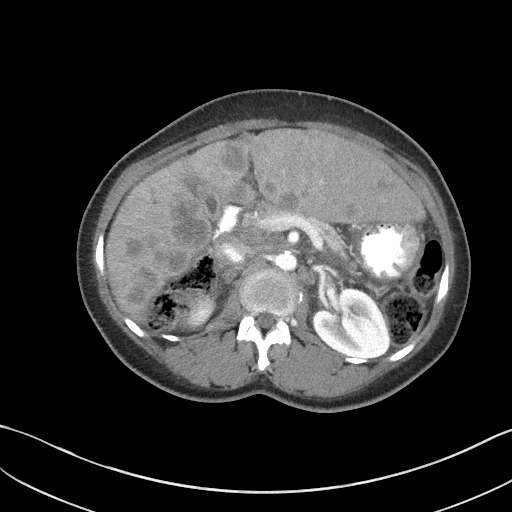
[im 27/48  soft-tissue]
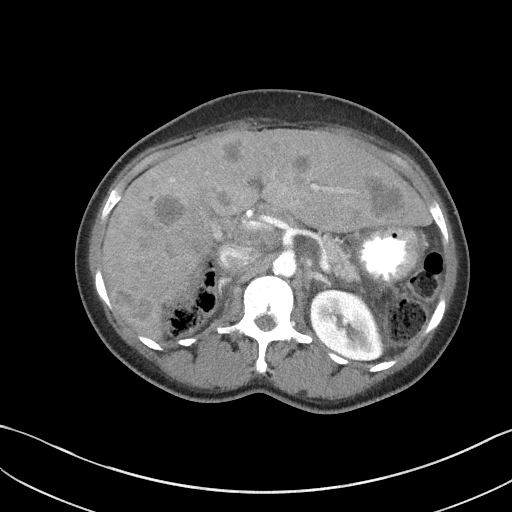
[im 33/48  soft-tissue]
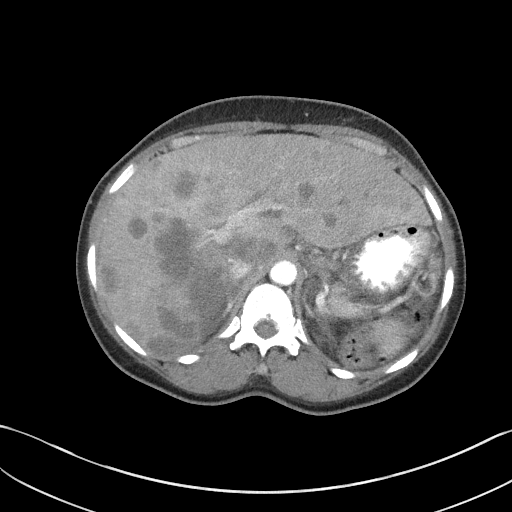
[im 36/48  soft-tissue]
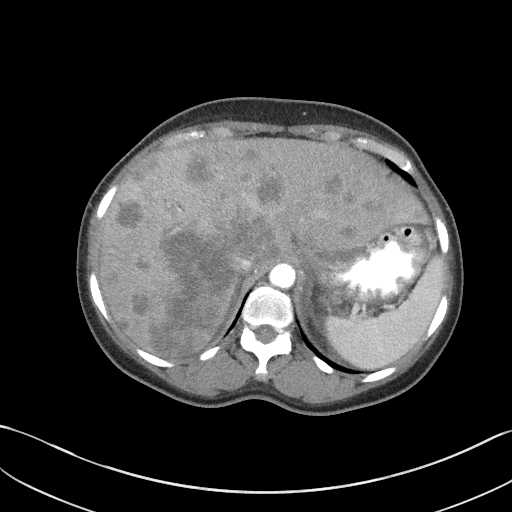
[im 36/48  bone]
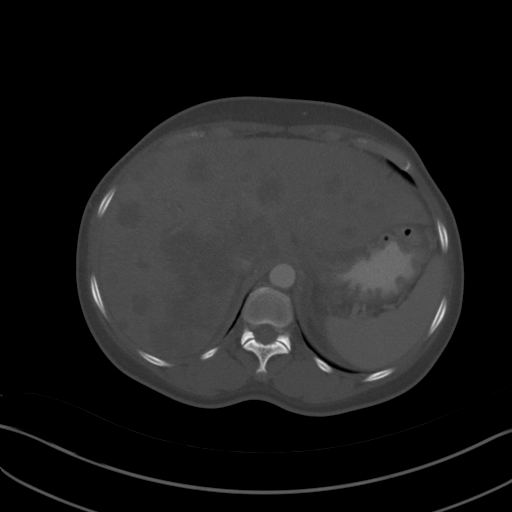
[im 39/48  soft-tissue]
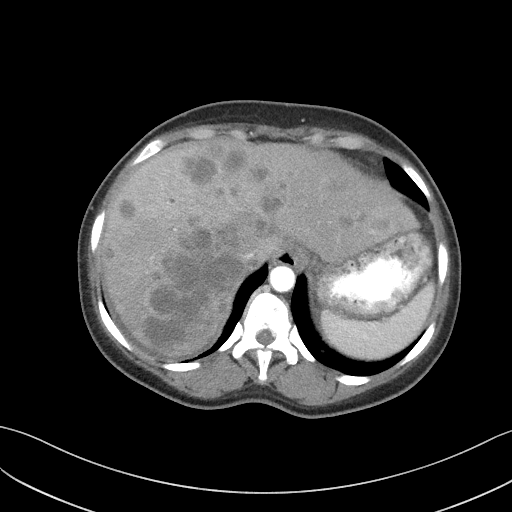
[im 45/48  soft-tissue]
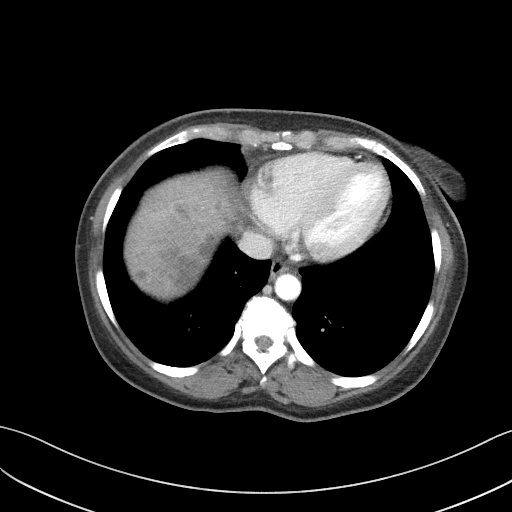

[Series 5: coronal st · coronal · 0.52mm/px · 3 of 101 slices shown]
[im 34/101  soft-tissue]
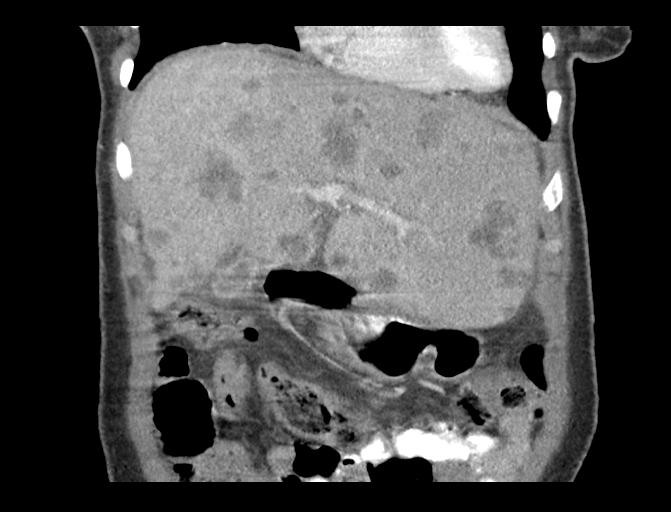
[im 45/101  soft-tissue]
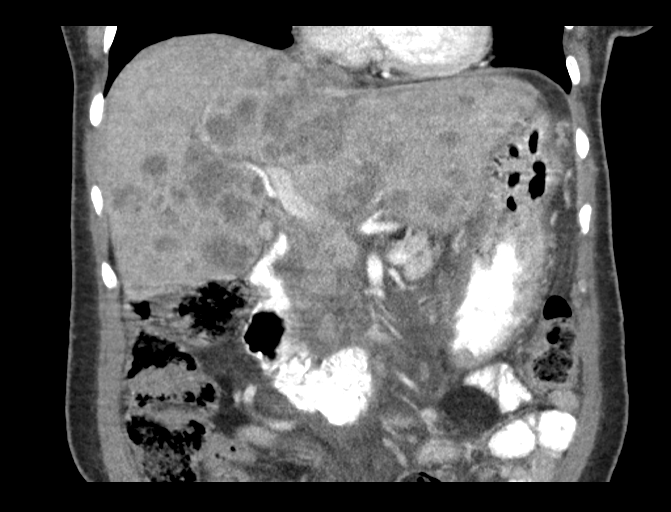
[im 56/101  soft-tissue]
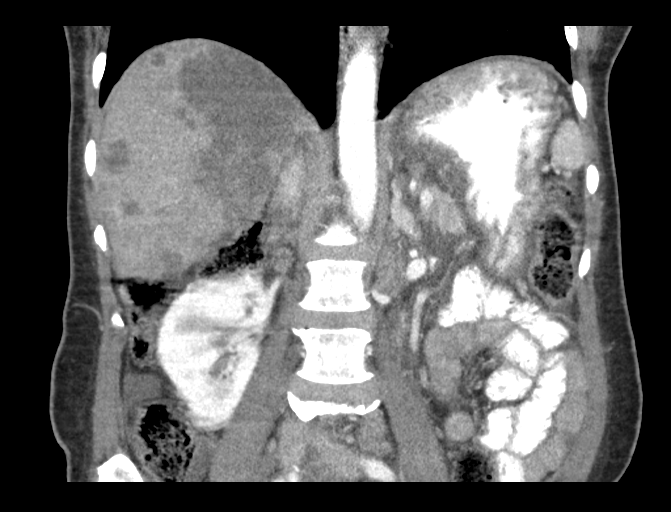

[14 of 46 positions shown; findings below may reference images not displayed]

FINDINGS: Lower chest: Anterior right lower lobe 5 mm solid pulmonary nodule
(series 7/ image 1) is slightly increased from 4 mm on 02/10/2016.

Hepatobiliary: Liver is enlarged by numerous (greater than 20)
confluent heterogeneously hypoenhancing liver masses of varying
size, with representative masses as follows:

- posterior upper right liver lobe 6.3 x 6.1 cm mass (series 2/image
7), previously 6.4 x 6.0 cm, not appreciably changed

- lateral segment left liver lobe 3.1 x 2.8 cm mass (series 2/image
21), previously 3.0 x 2.8 cm, not appreciably changed

- segment 4B left liver lobe 2.8 x 2.5 cm mass (series 2/image 23),
previously 2.5 x 2.4 cm, mildly increased

Relatively contracted gallbladder with diffuse gallbladder wall
thickening, not appreciably changed. Ill-defined dense material in
the gallbladder lumen could represent sludge and/ or gallstones.
There is new mild intrahepatic biliary ductal dilatation in the
right liver lobe. No intrahepatic biliary ductal dilatation in the
left liver lobe. Normal caliber common bile duct (3 mm diameter).

Pancreas: Stable top-normal caliber main pancreatic duct (2 mm
diameter). No discrete pancreatic mass.

Spleen: Normal size. No mass.

Adrenals/Urinary Tract: Stable 1.7 cm right adrenal nodule. No
discrete left adrenal nodule. No hydronephrosis. No renal mass.

Stomach/Bowel: Grossly normal stomach. Visualized small and large
bowel is normal caliber, with no bowel wall thickening.

Vascular/Lymphatic: Atherosclerotic nonaneurysmal abdominal aorta.
Stable narrowing of the main portal vein by porta hepatis
lymphadenopathy. Main portal vein remains patent. Patent splenic and
right renal vein. Stable narrowing of the left renal vein by
extrinsic compression from retroperitoneal adenopathy. No
appreciable change in widespread retroperitoneal lymphadenopathy
involving the gastrohepatic ligament, porta hepatis, portacaval,
aortocaval and left para-aortic chains. For example a 1.4 cm
gastrohepatic ligament node (series 2/ image 16), previously 1.5 cm
using similar measurement technique, unchanged. A 1.5 cm portacaval
node (series 2/ image 24), stable. A 1.4 cm left para-aortic node
(series 2/image 31), stable.

Other: No pneumoperitoneum. Trace upper abdominal ascites. No focal
fluid collection.

Musculoskeletal: No aggressive appearing focal osseous lesions.
Mild-to-moderate thoracolumbar spondylosis.
IMPRESSION: 1. Widespread hepatic metastatic disease is stable mildly increased
since 02/10/2016.
[DATE]. New mild intrahepatic biliary ductal dilatation in the right
liver lobe, due to obstruction by confluent central right liver lobe
metastases. No intrahepatic biliary ductal dilatation in the left
liver lobe. Normal caliber common bile duct.
3. Widespread retroperitoneal nodal metastatic disease appears
stable. Stable extrinsic narrowing of the main portal and left renal
veins by retroperitoneal adenopathy.
4. Right lower lobe pulmonary nodule has grown slightly since
02/10/2016, suspicious for pulmonary metastasis .
5. Stable right adrenal nodule suspicious for metastasis.
6. Trace upper abdominal ascites.
7. Contracted thick-walled gallbladder with sludge versus
gallstones, unchanged.
8. Aortic atherosclerosis .
